# Patient Record
Sex: Male | Born: 1970 | Race: Black or African American | Hispanic: No | Marital: Married | Smoking: Never smoker
Health system: Southern US, Community
[De-identification: ages and names within clinical notes are randomized; demographics above are authoritative.]

## PROBLEM LIST (undated history)

## (undated) DIAGNOSIS — W3400XA Accidental discharge from unspecified firearms or gun, initial encounter: Secondary | ICD-10-CM

## (undated) HISTORY — PX: FOOT FRACTURE SURGERY: SHX645

---

## 1998-06-26 ENCOUNTER — Emergency Department (HOSPITAL_COMMUNITY): Admission: EM | Admit: 1998-06-26 | Discharge: 1998-06-26 | Payer: Self-pay

## 1998-06-27 ENCOUNTER — Emergency Department (HOSPITAL_COMMUNITY): Admission: EM | Admit: 1998-06-27 | Discharge: 1998-06-27 | Payer: Self-pay | Admitting: Emergency Medicine

## 2001-06-07 ENCOUNTER — Emergency Department (HOSPITAL_COMMUNITY): Admission: EM | Admit: 2001-06-07 | Discharge: 2001-06-07 | Payer: Self-pay | Admitting: Emergency Medicine

## 2001-06-07 ENCOUNTER — Encounter: Payer: Self-pay | Admitting: Emergency Medicine

## 2001-06-08 ENCOUNTER — Emergency Department (HOSPITAL_COMMUNITY): Admission: EM | Admit: 2001-06-08 | Discharge: 2001-06-08 | Payer: Self-pay | Admitting: *Deleted

## 2001-06-11 ENCOUNTER — Ambulatory Visit (HOSPITAL_COMMUNITY): Admission: RE | Admit: 2001-06-11 | Discharge: 2001-06-11 | Payer: Self-pay | Admitting: Orthopedic Surgery

## 2003-05-12 ENCOUNTER — Emergency Department (HOSPITAL_COMMUNITY): Admission: EM | Admit: 2003-05-12 | Discharge: 2003-05-12 | Payer: Self-pay | Admitting: Emergency Medicine

## 2004-09-18 ENCOUNTER — Emergency Department (HOSPITAL_COMMUNITY): Admission: EM | Admit: 2004-09-18 | Discharge: 2004-09-18 | Payer: Self-pay | Admitting: Family Medicine

## 2009-10-19 ENCOUNTER — Encounter: Admission: RE | Admit: 2009-10-19 | Discharge: 2009-10-19 | Payer: Self-pay | Admitting: Internal Medicine
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
## 2013-10-28 ENCOUNTER — Emergency Department (HOSPITAL_COMMUNITY)
Admission: EM | Admit: 2013-10-28 | Discharge: 2013-10-28 | Disposition: A | Discharge status: Home / Self Care | Payer: Managed Care, Other (non HMO) | Attending: Emergency Medicine | Admitting: Emergency Medicine

## 2013-10-28 ENCOUNTER — Encounter (HOSPITAL_COMMUNITY): Payer: Self-pay | Admitting: Emergency Medicine

## 2013-10-28 ENCOUNTER — Emergency Department (HOSPITAL_COMMUNITY): Payer: Managed Care, Other (non HMO)

## 2013-10-28 DIAGNOSIS — S298XXA Other specified injuries of thorax, initial encounter: Secondary | ICD-10-CM | POA: Insufficient documentation

## 2013-10-28 DIAGNOSIS — R0789 Other chest pain: Secondary | ICD-10-CM

## 2013-10-28 DIAGNOSIS — S3982XA Other specified injuries of lower back, initial encounter: Secondary | ICD-10-CM | POA: Diagnosis not present

## 2013-10-28 DIAGNOSIS — Y92410 Unspecified street and highway as the place of occurrence of the external cause: Secondary | ICD-10-CM | POA: Insufficient documentation

## 2013-10-28 DIAGNOSIS — Y9389 Activity, other specified: Secondary | ICD-10-CM | POA: Insufficient documentation

## 2013-10-28 HISTORY — DX: Accidental discharge from unspecified firearms or gun, initial encounter: W34.00XA

## 2013-10-28 MED ORDER — NAPROXEN 500 MG PO TABS: 500.0000 mg | ORAL_TABLET | Freq: Two times a day (BID) | ORAL | Status: AC

## 2013-10-28 MED ORDER — METHOCARBAMOL 500 MG PO TABS: 1000.0000 mg | ORAL_TABLET | Freq: Four times a day (QID) | ORAL | Status: AC

## 2013-10-28 NOTE — ED Provider Notes (Signed)
Medical screening examination/treatment/procedure(s) were performed by non-physician practitioner and as supervising physician I was immediately available for consultation/collaboration.    Linwood DibblesJon Loretta Kluender, MD 10/28/13 2009
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
## 2014-12-01 ENCOUNTER — Emergency Department (HOSPITAL_COMMUNITY): Payer: BLUE CROSS/BLUE SHIELD

## 2014-12-01 ENCOUNTER — Encounter (HOSPITAL_COMMUNITY): Payer: Self-pay | Admitting: Emergency Medicine

## 2014-12-01 ENCOUNTER — Emergency Department (HOSPITAL_COMMUNITY)
Admission: EM | Admit: 2014-12-01 | Discharge: 2014-12-02 | Disposition: A | Discharge status: Home / Self Care | Payer: BLUE CROSS/BLUE SHIELD | Attending: Emergency Medicine | Admitting: Emergency Medicine

## 2014-12-01 DIAGNOSIS — S4991XA Unspecified injury of right shoulder and upper arm, initial encounter: Secondary | ICD-10-CM | POA: Diagnosis not present

## 2014-12-01 DIAGNOSIS — S0993XA Unspecified injury of face, initial encounter: Secondary | ICD-10-CM | POA: Diagnosis present

## 2014-12-01 DIAGNOSIS — Y92410 Unspecified street and highway as the place of occurrence of the external cause: Secondary | ICD-10-CM | POA: Diagnosis not present

## 2014-12-01 DIAGNOSIS — Y9389 Activity, other specified: Secondary | ICD-10-CM | POA: Insufficient documentation

## 2014-12-01 DIAGNOSIS — Y998 Other external cause status: Secondary | ICD-10-CM | POA: Diagnosis not present

## 2014-12-01 DIAGNOSIS — IMO0002 Reserved for concepts with insufficient information to code with codable children: Secondary | ICD-10-CM

## 2014-12-01 MED ORDER — SODIUM CHLORIDE 0.9 % IV BOLUS (SEPSIS)
1000.0000 mL | Freq: Once | INTRAVENOUS | Status: AC
  Administered 2014-12-01: 1000 mL via INTRAVENOUS

## 2014-12-01 MED ORDER — TETANUS-DIPHTH-ACELL PERTUSSIS 5-2.5-18.5 LF-MCG/0.5 IM SUSP
0.5000 mL | Freq: Once | INTRAMUSCULAR | Status: AC
  Administered 2014-12-01: 0.5 mL via INTRAMUSCULAR
  Filled 2014-12-01: qty 0.5

## 2014-12-01 MED ORDER — FENTANYL CITRATE (PF) 100 MCG/2ML IJ SOLN
100.0000 ug | Freq: Once | INTRAMUSCULAR | Status: AC
  Administered 2014-12-01: 50 ug via INTRAVENOUS
  Filled 2014-12-01: qty 2
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
## 2014-12-02 ENCOUNTER — Emergency Department (HOSPITAL_COMMUNITY): Payer: BLUE CROSS/BLUE SHIELD

## 2014-12-02 LAB — CBC WITH DIFFERENTIAL/PLATELET
BAND NEUTROPHILS: 0 %
BASOS ABS: 0 10*3/uL (ref 0.0–0.1)
Basophils Relative: 0 %
Blasts: 0 %
EOS PCT: 6 %
Eosinophils Absolute: 0.2 10*3/uL (ref 0.0–0.7)
HCT: 42.6 % (ref 39.0–52.0)
Hemoglobin: 14.2 g/dL (ref 13.0–17.0)
Lymphocytes Relative: 43 %
Lymphs Abs: 1.1 10*3/uL (ref 0.7–4.0)
MCH: 29.3 pg (ref 26.0–34.0)
MCHC: 33.3 g/dL (ref 30.0–36.0)
MCV: 87.8 fL (ref 78.0–100.0)
METAMYELOCYTES PCT: 0 %
MYELOCYTES: 0 %
Monocytes Absolute: 0.3 10*3/uL (ref 0.1–1.0)
Monocytes Relative: 10 %
Neutro Abs: 1 10*3/uL — ABNORMAL LOW (ref 1.7–7.7)
Neutrophils Relative %: 41 %
Other: 0 %
Platelets: 203 10*3/uL (ref 150–400)
Promyelocytes Absolute: 0 %
RBC: 4.85 MIL/uL (ref 4.22–5.81)
RDW: 13.3 % (ref 11.5–15.5)
WBC: 2.6 10*3/uL — AB (ref 4.0–10.5)
nRBC: 0 /100 WBC

## 2014-12-02 LAB — ETHANOL: Alcohol, Ethyl (B): <5 mg/dL (ref <5)

## 2014-12-02 LAB — BASIC METABOLIC PANEL
ANION GAP: 12 (ref 5–15)
BUN: 12 mg/dL (ref 6–20)
CO2: 24 mmol/L (ref 22–32)
Calcium: 9.4 mg/dL (ref 8.9–10.3)
Chloride: 101 mmol/L (ref 101–111)
Creatinine, Ser: 1.1 mg/dL (ref 0.61–1.24)
GFR calc Af Amer: >60 mL/min (ref >60)
Glucose, Bld: 89 mg/dL (ref 65–99)
POTASSIUM: 4.1 mmol/L (ref 3.5–5.1)
SODIUM: 137 mmol/L (ref 135–145)

## 2014-12-02 LAB — RAPID URINE DRUG SCREEN, HOSP PERFORMED
Amphetamines: NOT DETECTED
BARBITURATES: NOT DETECTED
BENZODIAZEPINES: NOT DETECTED
Cocaine: NOT DETECTED
OPIATES: NOT DETECTED
Tetrahydrocannabinol: NOT DETECTED

## 2014-12-02 MED ORDER — IOHEXOL 300 MG/ML  SOLN
100.0000 mL | Freq: Once | INTRAMUSCULAR | Status: AC | PRN
Start: 2014-12-02 — End: 2014-12-02
  Administered 2014-12-02: 100 mL via INTRAVENOUS

## 2014-12-02 MED ORDER — MORPHINE SULFATE (PF) 4 MG/ML IV SOLN
4.0000 mg | Freq: Once | INTRAVENOUS | Status: AC
Start: 2014-12-02 — End: 2014-12-02
  Administered 2014-12-02: 4 mg via INTRAVENOUS
  Filled 2014-12-02: qty 1

## 2014-12-02 MED ORDER — MORPHINE SULFATE (PF) 4 MG/ML IV SOLN
4.0000 mg | Freq: Once | INTRAVENOUS | Status: AC
  Administered 2014-12-02: 4 mg via INTRAVENOUS
  Filled 2014-12-02: qty 1

## 2014-12-02 MED ORDER — ONDANSETRON HCL 4 MG/2ML IJ SOLN
INTRAMUSCULAR | Status: AC
  Filled 2014-12-02: qty 2

## 2014-12-02 MED ORDER — ONDANSETRON HCL 4 MG/2ML IJ SOLN
4.0000 mg | Freq: Once | INTRAMUSCULAR | Status: AC
  Administered 2014-12-02: 4 mg via INTRAVENOUS

## 2014-12-02 MED ORDER — ONDANSETRON HCL 4 MG/2ML IJ SOLN
4.0000 mg | Freq: Once | INTRAMUSCULAR | Status: AC
Start: 2014-12-02 — End: 2014-12-02
  Administered 2014-12-02: 4 mg via INTRAVENOUS
  Filled 2014-12-02: qty 2
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
## 2014-12-28 ENCOUNTER — Ambulatory Visit: Payer: BLUE CROSS/BLUE SHIELD | Attending: Internal Medicine | Admitting: Physical Therapy

## 2014-12-28 DIAGNOSIS — M545 Low back pain, unspecified: Secondary | ICD-10-CM

## 2014-12-28 DIAGNOSIS — M2560 Stiffness of unspecified joint, not elsewhere classified: Secondary | ICD-10-CM | POA: Diagnosis present

## 2014-12-28 DIAGNOSIS — M6281 Muscle weakness (generalized): Secondary | ICD-10-CM | POA: Diagnosis present

## 2014-12-28 NOTE — Patient Instructions (Signed)
Copyright  VHI. All rights reserved.  Short walks several times a day.  Frequent change of position.  Limit sitting.    Pelvic Tilt   Flatten back by tightening stomach muscles and buttocks. Repeat __8__ times per set. Do ___1_ sets per session. Do __3__ sessions per day.  http://orth.exer.us/134   Copyright  VHI. All rights reserved. Knee to Chest (Flexion)   Pull knee toward chest. Feel stretch in lower back or buttock area. Breathing deeply, Hold __10__ seconds. Repeat with other knee. Repeat __8__ times. Do __3__ sessions per day.  http://[email or web address].exer.us/225   Copyright  VHI. All rights reserved.   Lower Trunk Rotation Stretch   Keeping back flat and feet together, rotate knees to left side. Hold _10___ seconds. Repeat __3__ times per set. Do _1___ sets per session. Do __3__ sessions per day.  http://orth.exer.us/122   Copyright  VHI. All rights reserved.  Supine: Leg Stretch With Strap (Basic)   Lie on back with one knee bent, foot flat on floor. Hook strap around other foot. Straighten knee. Keep knee level with other knee. Hold _10__ seconds. Relax leg completely down to floor.  Repeat __3_ times per session. Do __3_ sessions per day.  Copyright  VHI. All rights reserved.  Standing Arch (Extension)   Place hands in small of back. Using hands as fulcrum, arch backward. Try to keep knees straight. Great exercise if sitting makes pain worse. Use to break up long periods of sitting. Repeat __8__ times. Do _3___ sessions per day.  http://[email or web address].exer.us/247   Copyright  VHI. All rights reserved.  Straight Leg Calf Stretch (Gastroc)   Put palms against wall, one leg forward and bent. With other leg back straight and heel flat on floor, lean into wall. Hold _10___ seconds. Change legs and repeat. Repeat ___3_ times. Do ____3 sessions per day.  http://[email or web address].exer.us/419   Copyright  VHI. All rights reserved.    Copyright  VHI. All rights reserved.
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
## 2015-01-04 ENCOUNTER — Ambulatory Visit: Payer: BLUE CROSS/BLUE SHIELD | Admitting: Physical Therapy

## 2015-01-10 ENCOUNTER — Encounter: Payer: Managed Care, Other (non HMO) | Admitting: Physical Therapy

## 2015-01-17 ENCOUNTER — Encounter: Payer: Managed Care, Other (non HMO) | Admitting: Physical Therapy

## 2015-01-23 ENCOUNTER — Encounter: Payer: Managed Care, Other (non HMO) | Admitting: Physical Therapy

## 2015-01-30 ENCOUNTER — Encounter: Payer: Managed Care, Other (non HMO) | Admitting: Physical Therapy

## 2015-02-01 ENCOUNTER — Encounter: Payer: Managed Care, Other (non HMO) | Admitting: Physical Therapy
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
## 2018-11-03 ENCOUNTER — Other Ambulatory Visit: Payer: Self-pay

## 2018-11-03 DIAGNOSIS — Z20822 Contact with and (suspected) exposure to covid-19: Secondary | ICD-10-CM

## 2018-11-05 LAB — NOVEL CORONAVIRUS, NAA: SARS-CoV-2, NAA: DETECTED — AB
